# Patient Record
Sex: Male | Born: 1970 | Race: Black or African American | Hispanic: No | Marital: Married | Smoking: Current every day smoker
Health system: Southern US, Community
[De-identification: ages and names within clinical notes are randomized; demographics above are authoritative.]

## PROBLEM LIST (undated history)

## (undated) DIAGNOSIS — S76112A Strain of left quadriceps muscle, fascia and tendon, initial encounter: Secondary | ICD-10-CM

## (undated) DIAGNOSIS — F17200 Nicotine dependence, unspecified, uncomplicated: Secondary | ICD-10-CM

---

## 2006-02-27 ENCOUNTER — Emergency Department (HOSPITAL_COMMUNITY): Admission: EM | Admit: 2006-02-27 | Discharge: 2006-02-27 | Payer: Self-pay | Admitting: Emergency Medicine

## 2006-07-29 ENCOUNTER — Ambulatory Visit: Payer: Self-pay | Admitting: Nurse Practitioner

## 2006-08-10 ENCOUNTER — Ambulatory Visit (HOSPITAL_COMMUNITY): Admission: RE | Admit: 2006-08-10 | Discharge: 2006-08-10 | Payer: Self-pay | Admitting: Nurse Practitioner

## 2006-08-18 ENCOUNTER — Ambulatory Visit: Payer: Self-pay | Admitting: Nurse Practitioner

## 2006-08-20 ENCOUNTER — Ambulatory Visit (HOSPITAL_COMMUNITY): Admission: RE | Admit: 2006-08-20 | Discharge: 2006-08-20 | Payer: Self-pay | Admitting: Family Medicine

## 2006-09-02 ENCOUNTER — Ambulatory Visit: Payer: Self-pay | Admitting: *Deleted

## 2007-05-26 ENCOUNTER — Encounter (INDEPENDENT_AMBULATORY_CARE_PROVIDER_SITE_OTHER): Payer: Self-pay | Admitting: *Deleted

## 2009-02-21 ENCOUNTER — Emergency Department (HOSPITAL_COMMUNITY): Admission: EM | Admit: 2009-02-21 | Discharge: 2009-02-21 | Payer: Self-pay | Admitting: Emergency Medicine

## 2010-07-09 ENCOUNTER — Ambulatory Visit: Payer: Self-pay | Admitting: Internal Medicine

## 2010-08-13 ENCOUNTER — Encounter (INDEPENDENT_AMBULATORY_CARE_PROVIDER_SITE_OTHER): Payer: Self-pay | Admitting: *Deleted

## 2010-08-13 LAB — CONVERTED CEMR LAB
AST: 27 units/L (ref 0–37)
Alkaline Phosphatase: 40 units/L (ref 39–117)
Basophils Absolute: 0 10*3/uL (ref 0.0–0.1)
Basophils Relative: 0 % (ref 0–1)
Eosinophils Absolute: 0.1 10*3/uL (ref 0.0–0.7)
Eosinophils Relative: 2 % (ref 0–5)
Glucose, Bld: 81 mg/dL (ref 70–99)
Lymphocytes Relative: 53 % — ABNORMAL HIGH (ref 12–46)
Monocytes Relative: 7 % (ref 3–12)
Neutro Abs: 2.6 10*3/uL (ref 1.7–7.7)
Platelets: 173 10*3/uL (ref 150–400)
Sodium: 141 meq/L (ref 135–145)
Total Bilirubin: 0.4 mg/dL (ref 0.3–1.2)
Uric Acid, Serum: 3.5 mg/dL — ABNORMAL LOW (ref 4.0–7.8)
WBC: 6.8 10*3/uL (ref 4.0–10.5)

## 2010-09-10 ENCOUNTER — Ambulatory Visit (HOSPITAL_COMMUNITY)
Admission: RE | Admit: 2010-09-10 | Discharge: 2010-09-10 | Payer: Self-pay | Source: Home / Self Care | Attending: Gastroenterology | Admitting: Gastroenterology

## 2010-10-04 NOTE — Op Note (Signed)
  [name], Geoffrey Robertson              ACCOUNT NO.:  [id number]  MEDICAL RECORD NO.:  [id number]          PATIENT TYPE:  AMB  LOCATION:  ENDO                         FACILITY:  [facility]  PHYSICIAN:  Bernette Redbird, M.D.   DATE OF BIRTH:  03-20-1971  DATE OF PROCEDURE:  09/10/2010 DATE OF DISCHARGE:                              OPERATIVE REPORT   PROCEDURE:  Colonoscopy.  INDICATION:  A 40 year old African-American male with intermittent rectal bleeding and heme-positive stool on digital exam, attributed most likely to hemorrhoidal origin.  FINDINGS:  Normal exam to the cecum except for moderate internal hemorrhoids.  DESCRIPTION OF PROCEDURE:  The nature, purpose, risks and alternatives of the procedure have been discussed with the patient who provided written consent, coming as an outpatient to the [facility] Unit.  Sedation prior to and during the course of the procedure was fentanyl 100 mcg and Versed 10 mg IV without any clinical instability during the course the procedure.  Perianal exam was unremarkable. Digital exam showed a normal prostate and no rectal masses.  The Pentax adult video colonoscope was advanced with moderate difficulty to the cecum, using external abdominal compression and placing the patient in the supine position to help control looping.  Looping was the main reason for difficulty with advancement.  The quality of the prep was excellent and it is felt that all areas were well seen.  This was a normal examination except for moderate internal hemorrhoids, best seen on retroflex viewing but also during pullout through the anal canal.  Specifically, no polyps, masses, diverticular disease or inflammation were seen.  There was one 3-mm erythematous area that might represent a small AVM in the midportion of colon.  Retroflex viewing showed hypertrophied anal papillae and moderate internal hemorrhoids that were somewhat excoriated.  No biopsies  were obtained.  The patient tolerated the procedure well and there were no apparent complications.  IMPRESSION:  Moderate internal hemorrhoids which are the presumed source of the patient's bleeding.  No alternative source of bleeding identified.  PLAN: 1. Reassurance. 2. P.r.n. surgical intervention such as ligation therapy or injection     therapy to reduce rectal bleeding. 3. Repeat colonoscopy in 10 years for screening purposes.          ______________________________ Bernette Redbird, M.D.     RB/MEDQ  D:  09/10/2010  T:  09/10/2010  Job:  [id number]  cc:   Georganna Skeans, MD Fax: [phone]  Electronically Signed by Bernette Redbird M.D. on 10/04/2010 04:52:10 PM

## 2011-01-18 ENCOUNTER — Emergency Department (HOSPITAL_COMMUNITY)
Admission: EM | Admit: 2011-01-18 | Discharge: 2011-01-18 | Disposition: A | Discharge status: Home / Self Care | Payer: Self-pay | Attending: Emergency Medicine | Admitting: Emergency Medicine

## 2011-01-18 ENCOUNTER — Emergency Department (HOSPITAL_COMMUNITY): Payer: Self-pay

## 2011-01-18 DIAGNOSIS — S61209A Unspecified open wound of unspecified finger without damage to nail, initial encounter: Secondary | ICD-10-CM | POA: Insufficient documentation

## 2011-01-18 DIAGNOSIS — M79609 Pain in unspecified limb: Secondary | ICD-10-CM | POA: Insufficient documentation

## 2011-01-18 DIAGNOSIS — X58XXXA Exposure to other specified factors, initial encounter: Secondary | ICD-10-CM | POA: Insufficient documentation

## 2017-01-07 ENCOUNTER — Ambulatory Visit (INDEPENDENT_AMBULATORY_CARE_PROVIDER_SITE_OTHER): Payer: Self-pay

## 2017-01-07 ENCOUNTER — Ambulatory Visit (INDEPENDENT_AMBULATORY_CARE_PROVIDER_SITE_OTHER): Payer: Self-pay | Admitting: Podiatry

## 2017-01-07 DIAGNOSIS — S96829A Laceration of other specified muscles and tendons at ankle and foot level, unspecified foot, initial encounter: Secondary | ICD-10-CM

## 2017-01-07 DIAGNOSIS — S96929A Laceration of unspecified muscle and tendon at ankle and foot level, unspecified foot, initial encounter: Secondary | ICD-10-CM

## 2017-01-07 DIAGNOSIS — M79671 Pain in right foot: Secondary | ICD-10-CM

## 2017-01-07 NOTE — Patient Instructions (Signed)
Pre-Operative Instructions  Congratulations, you have decided to take an important step to improving your quality of life.  You can be assured that the doctors of [facility] will be with you every step of the way.  1. Plan to be at the surgery center/hospital at least 1 (one) hour prior to your scheduled time unless otherwise directed by the surgical center/hospital staff.  You must have a responsible adult accompany you, remain during the surgery and drive you home.  Make sure you have directions to the surgical center/hospital and know how to get there on time. 2. For hospital based surgery you will need to obtain a history and physical form from your family physician within 1 month prior to the date of surgery- we will give you a form for you primary physician.  3. We make every effort to accommodate the date you request for surgery.  There are however, times where surgery dates or times have to be moved.  We will contact you as soon as possible if a change in schedule is required.   4. No Aspirin/Ibuprofen for one week before surgery.  If you are on aspirin, any non-steroidal anti-inflammatory medications (Mobic, Aleve, Ibuprofen) you should stop taking it 7 days prior to your surgery.  You make take Tylenol  For pain prior to surgery.  5. Medications- If you are taking daily heart and blood pressure medications, seizure, reflux, allergy, asthma, anxiety, pain or diabetes medications, make sure the surgery center/hospital is aware before the day of surgery so they may notify you which medications to take or avoid the day of surgery. 6. No food or drink after midnight the night before surgery unless directed otherwise by surgical center/hospital staff. 7. No alcoholic beverages 24 hours prior to surgery.  No smoking 24 hours prior to or 24 hours after surgery. 8. Wear loose pants or shorts- loose enough to fit over bandages, boots, and casts. 9. No slip on shoes, sneakers are best. 10. Bring  your boot with you to the surgery center/hospital.  Also bring crutches or a walker if your physician has prescribed it for you.  If you do not have this equipment, it will be provided for you after surgery. 11. If you have not been contracted by the surgery center/hospital by the day before your surgery, call to confirm the date and time of your surgery. 12. Leave-time from work may vary depending on the type of surgery you have.  Appropriate arrangements should be made prior to surgery with your employer. 13. Prescriptions will be provided immediately following surgery by your doctor.  Have these filled as soon as possible after surgery and take the medication as directed. 14. Remove nail polish on the operative foot. 15. Wash the night before surgery.  The night before surgery wash the foot and leg well with the antibacterial soap provided and water paying special attention to beneath the toenails and in between the toes.  Rinse thoroughly with water and dry well with a towel.  Perform this wash unless told not to do so by your physician.  Enclosed: 1 Ice pack (please put in freezer the night before surgery)   1 Hibiclens skin cleaner   Pre-op Instructions  If you have any questions regarding the instructions, do not hesitate to call our office.  [place]: [address] [place], [place] [address] [phone]  [place]: [address] [place], [place] [address] [phone]  [place]: [address]  [place], [place] [address] [phone]   Dr.   Norman Regal DPM, Dr. Matthew Wagoner DPM, Dr. M. Todd Hyatt DPM, Dr. Titorya Stover DPM 

## 2017-01-08 ENCOUNTER — Ambulatory Visit
Admission: RE | Admit: 2017-01-08 | Discharge: 2017-01-08 | Disposition: A | Discharge status: Home / Self Care | Payer: Self-pay | Source: Ambulatory Visit | Attending: Podiatry | Admitting: Podiatry

## 2017-01-08 NOTE — Progress Notes (Signed)
   HPI:  46 year old healthy male presents today for evaluation of a laceration to the dorsal aspect of his right foot. Patient dropped a knife on his foot on 01/02/2017. Patient states that is seeing is the night fell on his foot he noticed that his great toes unable to dorsiflex. He states that his great toe is hanging. Patient presents today for further treatment and evaluation   Physical Exam: General: The patient is alert and oriented x3 in no acute distress.  Dermatology: Small laceration noted to the dorsal aspect of the right foot which appears stable with routine healing. No signs of infectious process noted. Skin is warm, dry and supple bilateral lower extremities. Negative for open lesions or macerations.  Vascular: Palpable pedal pulses bilaterally. No edema or erythema noted. Capillary refill within normal limits.  Neurological: Epicritic and protective threshold grossly intact bilaterally.   Musculoskeletal Exam: Loss of dorsiflexion noted to the right great toe. All other extensor tendons appear to be intact  Assessment: 1. EHL tendon laceration right foot   Plan of Care:  1. Patient was evaluated. 2. Today we are going to order an MRI of the right foot to determine the retraction of the proximal portion of the EHL tendon. 3. Today we will initiate authorization for surgery. Surgery will consist of primary repair of extensor tendon right foot. All possible complications and details of the procedure were explained. No guarantees were expressed or implied. 4. Return to clinic 1 week postop   Felecia ShellingBrent M. Evans, DPM [facility]  Dr. Felecia ShellingBrent M. Evans, DPM    [address]                                        [place], [place] [address]                Office [phone]  Fax [phone](336) [phone]

## 2017-01-15 ENCOUNTER — Encounter: Payer: Self-pay | Admitting: Podiatry

## 2017-01-15 ENCOUNTER — Ambulatory Visit: Payer: Self-pay

## 2017-01-15 VITALS — BP 128/78 | HR 74 | Temp 97.8°F

## 2017-01-15 DIAGNOSIS — S96829A Laceration of other specified muscles and tendons at ankle and foot level, unspecified foot, initial encounter: Secondary | ICD-10-CM

## 2017-01-15 DIAGNOSIS — S96929A Laceration of unspecified muscle and tendon at ankle and foot level, unspecified foot, initial encounter: Principal | ICD-10-CM

## 2017-01-15 DIAGNOSIS — Z9889 Other specified postprocedural states: Secondary | ICD-10-CM

## 2017-01-15 DIAGNOSIS — S96929D Laceration of unspecified muscle and tendon at ankle and foot level, unspecified foot, subsequent encounter: Secondary | ICD-10-CM

## 2017-01-15 HISTORY — PX: FOOT SURGERY: SHX648

## 2017-01-16 NOTE — Progress Notes (Signed)
Patient presents status post repair of laceration to the dorsal aspect of his right foot from previous injury. Surgery performed on morning of 01/15/17.   Patient presents in cast, noted moderate amount of blood on gauze and foot. Denies pain at this time, he states that his foot and leg are still numb from anesthesia. VS stable at 128/78 74 97.8T. Denies chest pain SHOB or any other acute symptoms.   Cast was removed leaving sterile gauze in place, moderate amount of blood noted on gauze on dorsal aspect of foot, but no active bleeding present. Sterile gauze applied to present dressing, cast re-applied .   Advised patient of any acute symptom changes that needed to be reported, or he will need to present to ED.  Wife present, patient and wife verbalized understanding of instructions.   Follow up with Dr Logan BoresEvans at regularly schedule appt next week, sooner if acute symptoms occur.

## 2017-01-21 ENCOUNTER — Ambulatory Visit: Payer: Self-pay

## 2017-01-21 ENCOUNTER — Ambulatory Visit (INDEPENDENT_AMBULATORY_CARE_PROVIDER_SITE_OTHER): Payer: Self-pay | Admitting: Podiatry

## 2017-01-21 ENCOUNTER — Encounter: Payer: Self-pay | Admitting: Podiatry

## 2017-01-21 DIAGNOSIS — Z9889 Other specified postprocedural states: Secondary | ICD-10-CM

## 2017-01-22 NOTE — Progress Notes (Signed)
   Subjective:  Patient presents today status post EHL tendon repair of the right foot. DOS: 01/15/17. Patient states he is doing well and has no complaints at this time.   Objective/Physical Exam Skin incisions appear to be well coapted with sutures and staples intact. No sign of infectious process noted. No dehiscence. No active bleeding noted. Moderate edema noted to the surgical extremity.   Assessment: 1. s/p EHL tendon repair of the right foot. DOS: 01/15/17   Plan of Care:  1. Patient was evaluated. 2. Cast removed. 3. Continue nonweightbearing in the cam boot. 4. Return to clinic in 1 week for staple removal.   Felecia ShellingBrent M. Evans, DPM [facility]  Dr. Felecia ShellingBrent M. Evans, DPM    [address]                                        [place], [place] [address]                Office [phone]) [phone]  Fax [phone]

## 2017-01-28 ENCOUNTER — Encounter: Payer: Self-pay | Admitting: Podiatry

## 2017-01-28 ENCOUNTER — Ambulatory Visit (INDEPENDENT_AMBULATORY_CARE_PROVIDER_SITE_OTHER): Payer: Self-pay | Admitting: Podiatry

## 2017-01-28 VITALS — HR 73 | Temp 98.7°F

## 2017-01-28 DIAGNOSIS — Z9889 Other specified postprocedural states: Secondary | ICD-10-CM

## 2017-01-29 NOTE — Progress Notes (Signed)
1. Repair of extensor tendon right foot

## 2017-01-30 NOTE — Progress Notes (Signed)
   Subjective:  Patient presents today status post EHL tendon repair of the right foot. DOS: 01/15/17. Patient states he is doing well and has no complaints at this time.   Objective/Physical Exam Skin incisions appear to be well coapted with sutures and staples intact. No sign of infectious process noted. No dehiscence. No active bleeding noted. Moderate edema noted to the surgical extremity.   Assessment: 1. s/p EHL tendon repair of the right foot. DOS: 01/15/17   Plan of Care:  1. Patient was evaluated. 2. Dressing changed. 3. Staples removed. 4. Return to clinic in 2 weeks.   Felecia ShellingBrent M. Thermon Zulauf, DPM [facility]  Dr. Felecia ShellingBrent M. Shirley Bolle, DPM    [address]                                        [place], [place] [address]                Office [phone]  Fax [phone]

## 2017-02-16 ENCOUNTER — Ambulatory Visit (INDEPENDENT_AMBULATORY_CARE_PROVIDER_SITE_OTHER): Payer: Self-pay | Admitting: Podiatry

## 2017-02-16 ENCOUNTER — Encounter: Payer: Self-pay | Admitting: Podiatry

## 2017-02-16 DIAGNOSIS — Z9889 Other specified postprocedural states: Secondary | ICD-10-CM

## 2017-02-25 NOTE — Progress Notes (Signed)
   Subjective:  Patient presents today status post EHL tendon repair of the right foot. DOS: 01/15/17. Patient states he is doing well and has no complaints at this time. His only concern is when he can wash the foot.   Objective/Physical Exam Skin incisions appear to be well coapted. No sign of infectious process noted. No dehiscence. No active bleeding noted. Moderate edema noted to the surgical extremity.   Assessment: 1. s/p EHL tendon repair of the right foot. DOS: 01/15/17   Plan of Care:  1. Patient was evaluated. 2. Discontinue wearing CAM boot. Postop shoe dispensed.  3. Begin physical therapy, weight bearing and light activity.  4. Return to clinic when necessary.   Felecia ShellingBrent M. Lorrie Strauch, DPM [facility]  Dr. Felecia ShellingBrent M. Michale Emmerich, DPM    [address]                                        [place], [place] [address]                Office [phone]) [phone]  Fax [phone](336) [phone]

## 2018-01-24 ENCOUNTER — Emergency Department (HOSPITAL_COMMUNITY): Payer: Self-pay

## 2018-01-24 ENCOUNTER — Other Ambulatory Visit: Payer: Self-pay

## 2018-01-24 ENCOUNTER — Emergency Department (HOSPITAL_COMMUNITY)
Admission: EM | Admit: 2018-01-24 | Discharge: 2018-01-24 | Disposition: A | Discharge status: Home / Self Care | Payer: Self-pay | Attending: Emergency Medicine | Admitting: Emergency Medicine

## 2018-01-24 ENCOUNTER — Encounter (HOSPITAL_COMMUNITY): Payer: Self-pay | Admitting: Emergency Medicine

## 2018-01-24 DIAGNOSIS — M25512 Pain in left shoulder: Secondary | ICD-10-CM | POA: Insufficient documentation

## 2018-01-24 DIAGNOSIS — M25562 Pain in left knee: Secondary | ICD-10-CM | POA: Insufficient documentation

## 2018-01-24 DIAGNOSIS — Z79899 Other long term (current) drug therapy: Secondary | ICD-10-CM | POA: Insufficient documentation

## 2018-01-24 MED ORDER — KETOROLAC TROMETHAMINE 30 MG/ML IJ SOLN: 15.0000 mg | Freq: Once | INTRAMUSCULAR | Status: DC

## 2018-01-24 MED ORDER — CYCLOBENZAPRINE HCL 10 MG PO TABS: 10.0000 mg | ORAL_TABLET | Freq: Two times a day (BID) | ORAL | 0 refills | Status: DC | PRN

## 2018-01-24 MED ORDER — OXYCODONE-ACETAMINOPHEN 5-325 MG PO TABS: 1.0000 | ORAL_TABLET | ORAL | 0 refills | Status: DC | PRN

## 2018-01-24 MED ORDER — HYDROMORPHONE HCL 2 MG/ML IJ SOLN
1.0000 mg | Freq: Once | INTRAMUSCULAR | Status: AC
  Administered 2018-01-24: 1 mg via INTRAMUSCULAR
  Filled 2018-01-24: qty 1

## 2018-01-24 MED ORDER — KETOROLAC TROMETHAMINE 60 MG/2ML IM SOLN
30.0000 mg | Freq: Once | INTRAMUSCULAR | Status: AC
Start: 2018-01-24 — End: 2018-01-24
  Administered 2018-01-24: 30 mg via INTRAMUSCULAR
  Filled 2018-01-24: qty 2

## 2018-01-24 NOTE — ED Notes (Signed)
Pt. Stated, Im seeing Dr. Delbert Harness for my knee.

## 2018-01-24 NOTE — Discharge Instructions (Addendum)
Rest and ice the affected area Elevate - elevate ankle above level of heart to reduce swelling Ibuprofen - take with food. Take  up to 3-4 times daily Take narcotic pain medicine as needed for severe pain. Take muscle relaxer as needed for spasms Follow up with orthopedics

## 2018-01-24 NOTE — ED Provider Notes (Signed)
[facility] EMERGENCY DEPARTMENT Provider Note   CSN: [id number] Arrival date & time: 01/24/18  0915     History   Chief Complaint Chief Complaint  Patient presents with  . Fall  . Shoulder Pain  . Shoulder Injury    HPI Geoffrey Robertson is a 47 y.o. male who presents with left shoulder pain, left knee pain.  Patient states that he was doing box jumps at the gym yesterday and the box moved and he fell backwards with his left leg under him and his left arm outstretched behind him.  He denies hitting his head.  The patient states that he has a background in physical therapy and believes that he may have torn tendons in his shoulder and believes his quadriceps tendon is torn as well.  He has been able to walk.  He cannot lift his arm without using his right arm to lift it.  He reports severe pain.  He used leftover hydrocodone without significant relief.  He has an appointment with Dr. Eulah Pont tomorrow but states that the pain is so severe that he felt like he could not wait.  HPI  History reviewed. No pertinent past medical history.  There are no active problems to display for this patient.   History reviewed. No pertinent surgical history.      Home Medications    Prior to Admission medications   Medication Sig Start Date End Date Taking? Authorizing Provider  meloxicam (MOBIC) 15 MG tablet Take 15 mg by mouth daily.    Felecia Shelling, DPM  oxyCODONE-acetaminophen (PERCOCET/ROXICET) 5-325 MG tablet Take 1 tablet by mouth every 4 (four) hours as needed for severe pain.    Felecia Shelling, DPM    Family History No family history on file.  Social History Social History   Tobacco Use  . Smoking status: Unknown If Ever Smoked  . Smokeless tobacco: Never Used  Substance Use Topics  . Alcohol use: Not on file  . Drug use: Not on file     Allergies   Patient has no known allergies.   Review of Systems Review of Systems  Musculoskeletal: Positive for  arthralgias and myalgias. Negative for back pain, gait problem and joint swelling.  Neurological: Positive for weakness. Negative for numbness.     Physical Exam Updated Vital Signs BP (!) 132/93 (BP Location: Right Arm)   Pulse 61   Temp 98.3 F (36.8 C) (Oral)   Resp 12   Ht  (1.88 m)   Wt 89.4 kg (197 lb)   SpO2 96%   BMI 25.29 kg/m   Physical Exam  Constitutional: He is oriented to person, place, and time. He appears well-developed and well-nourished. No distress.  Calm and cooperative  HENT:  Head: Normocephalic and atraumatic.  Eyes: Pupils are equal, round, and reactive to light. Conjunctivae are normal. Right eye exhibits no discharge. Left eye exhibits no discharge. No scleral icterus.  Neck: Normal range of motion.  Cardiovascular: Normal rate.  Pulmonary/Chest: Effort normal. No respiratory distress.  Abdominal: He exhibits no distension.  Musculoskeletal:  Left shoulder: No obvious swelling, deformity, or warmth. Significant tenderness to palpation over the anterior and posterior shoulder. ROM deferred due to pain. 5/5 grip strength. N/V intact.  Left knee: Moderate, diffuse swelling of the knee. Palpable defect felt over the quadriceps tendon.   Neurological: He is alert and oriented to person, place, and time.  Skin: Skin is warm and dry.  Psychiatric: He has a  normal mood and affect. His behavior is normal.  Nursing note and vitals reviewed.    ED Treatments / Results  Labs (all labs ordered are listed, but only abnormal results are displayed) Labs Reviewed - No data to display  EKG None  Radiology Dg Shoulder Left  Result Date: 01/24/2018 CLINICAL DATA:  Pt complains of L shoulder pain, states his pain is on top of shoulder. Pt states he "did something stupid" at the gym to cause injury. No previous injuries or surgeries to L shoulder. Best obtainable images due to pt pain. Cou.*comment was truncated* EXAM: LEFT SHOULDER - 2+ VIEW COMPARISON:   None. FINDINGS: Glenohumeral joint is intact. No evidence of scapular fracture or humeral fracture. The acromioclavicular joint is intact. IMPRESSION: No fracture or dislocation. Electronically Signed   By: Genevive Bi M.D.   On: 01/24/2018 10:29    Procedures Procedures (including critical care time)  Medications Ordered in ED Medications  HYDROmorphone (DILAUDID) injection 1 mg (1 mg Intramuscular Given 01/24/18 1140)  ketorolac (TORADOL) injection 30 mg (30 mg Intramuscular Given 01/24/18 1138)     Initial Impression / Assessment and Plan / ED Course  I have reviewed the triage vital signs and the nursing notes.  Pertinent labs & imaging results that were available during my care of the patient were reviewed by me and considered in my medical decision making (see chart for details).  47 year old male with acute left shoulder pain and left knee pain after an injury yesterday doing box jumps.  He has difficulty lifting his shoulder secondary to pain.  X-ray is negative for fracture.  We will give him pain control and placement of sling.  Offered x-ray of the knee as well.  The patient declined stating that he has an appointment with Dr. Eulah Pont tomorrow and he knows it is not broken.  He has been able to walk on it.  He was given pain control and conservative care was discussed.  Return precautions were given.  Final Clinical Impressions(s) / ED Diagnoses   Final diagnoses:  Acute pain of left shoulder  Acute pain of left knee    ED Discharge Orders    None       Bethel Born, PA-C 01/24/18 1500    Loren Racer, MD 01/28/18 1310

## 2018-01-24 NOTE — ED Triage Notes (Signed)
Pt. Stated, I fell yesterday on my shoulder and it really hurts, can not get comfortable.  Left.

## 2018-01-24 NOTE — Progress Notes (Signed)
Orthopedic Tech Progress Note Patient Details:  Geoffrey Robertson [birth year] [id number]  Ortho Devices Type of Ortho Device: Shoulder immobilizer       Saul Fordyce 01/24/2018, 12:39 PM

## 2018-01-25 ENCOUNTER — Encounter (HOSPITAL_BASED_OUTPATIENT_CLINIC_OR_DEPARTMENT_OTHER): Payer: Self-pay | Admitting: *Deleted

## 2018-01-25 ENCOUNTER — Other Ambulatory Visit: Payer: Self-pay

## 2018-01-26 ENCOUNTER — Ambulatory Visit (HOSPITAL_BASED_OUTPATIENT_CLINIC_OR_DEPARTMENT_OTHER): Payer: Self-pay | Admitting: Certified Registered"

## 2018-01-26 ENCOUNTER — Other Ambulatory Visit: Payer: Self-pay

## 2018-01-26 ENCOUNTER — Encounter (HOSPITAL_BASED_OUTPATIENT_CLINIC_OR_DEPARTMENT_OTHER)
Admission: RE | Disposition: A | Discharge status: Home / Self Care | Payer: Self-pay | Source: Ambulatory Visit | Attending: Orthopedic Surgery

## 2018-01-26 ENCOUNTER — Ambulatory Visit (HOSPITAL_BASED_OUTPATIENT_CLINIC_OR_DEPARTMENT_OTHER)
Admission: RE | Admit: 2018-01-26 | Discharge: 2018-01-26 | Disposition: A | Discharge status: Home / Self Care | Payer: Self-pay | Source: Ambulatory Visit | Attending: Orthopedic Surgery | Admitting: Orthopedic Surgery

## 2018-01-26 ENCOUNTER — Encounter (HOSPITAL_BASED_OUTPATIENT_CLINIC_OR_DEPARTMENT_OTHER): Payer: Self-pay | Admitting: Certified Registered"

## 2018-01-26 DIAGNOSIS — S76112A Strain of left quadriceps muscle, fascia and tendon, initial encounter: Secondary | ICD-10-CM | POA: Insufficient documentation

## 2018-01-26 DIAGNOSIS — Y9339 Activity, other involving climbing, rappelling and jumping off: Secondary | ICD-10-CM | POA: Insufficient documentation

## 2018-01-26 DIAGNOSIS — F17210 Nicotine dependence, cigarettes, uncomplicated: Secondary | ICD-10-CM | POA: Insufficient documentation

## 2018-01-26 HISTORY — DX: Nicotine dependence, unspecified, uncomplicated: F17.200

## 2018-01-26 HISTORY — PX: QUADRICEPS TENDON REPAIR: SHX756

## 2018-01-26 HISTORY — DX: Strain of left quadriceps muscle, fascia and tendon, initial encounter: S76.112A

## 2018-01-26 SURGERY — REPAIR, TENDON, QUADRICEPS
Anesthesia: General | Site: Leg Upper | Laterality: Left

## 2018-01-26 MED ORDER — ROPIVACAINE HCL 7.5 MG/ML IJ SOLN
INTRAMUSCULAR | Status: DC | PRN
  Administered 2018-01-26: 25 mL via PERINEURAL

## 2018-01-26 MED ORDER — FENTANYL CITRATE (PF) 100 MCG/2ML IJ SOLN
25.0000 ug | INTRAMUSCULAR | Status: DC | PRN
  Administered 2018-01-26: 50 ug via INTRAVENOUS

## 2018-01-26 MED ORDER — ACETAMINOPHEN 500 MG PO TABS
1000.0000 mg | ORAL_TABLET | Freq: Once | ORAL | Status: AC
  Administered 2018-01-26: 1000 mg via ORAL

## 2018-01-26 MED ORDER — LACTATED RINGERS IV SOLN
INTRAVENOUS | Status: DC
  Administered 2018-01-26 (×2): via INTRAVENOUS

## 2018-01-26 MED ORDER — MEPERIDINE HCL 25 MG/ML IJ SOLN: 6.2500 mg | INTRAMUSCULAR | Status: DC | PRN

## 2018-01-26 MED ORDER — MIDAZOLAM HCL 2 MG/2ML IJ SOLN
INTRAMUSCULAR | Status: AC
  Filled 2018-01-26: qty 2

## 2018-01-26 MED ORDER — OXYCODONE HCL 5 MG PO TABS: 5.0000 mg | ORAL_TABLET | Freq: Once | ORAL | Status: DC | PRN

## 2018-01-26 MED ORDER — ASPIRIN EC 81 MG PO TBEC: 81.0000 mg | DELAYED_RELEASE_TABLET | Freq: Every day | ORAL | 0 refills | Status: AC

## 2018-01-26 MED ORDER — CHLORHEXIDINE GLUCONATE 4 % EX LIQD: 60.0000 mL | Freq: Once | CUTANEOUS | Status: DC

## 2018-01-26 MED ORDER — DOCUSATE SODIUM 100 MG PO CAPS: 100.0000 mg | ORAL_CAPSULE | Freq: Two times a day (BID) | ORAL | 0 refills | Status: AC

## 2018-01-26 MED ORDER — ONDANSETRON HCL 4 MG PO TABS: 4.0000 mg | ORAL_TABLET | Freq: Three times a day (TID) | ORAL | 0 refills | Status: AC | PRN

## 2018-01-26 MED ORDER — LIDOCAINE HCL (CARDIAC) PF 100 MG/5ML IV SOSY
PREFILLED_SYRINGE | INTRAVENOUS | Status: DC | PRN
  Administered 2018-01-26: 30 mg via INTRAVENOUS

## 2018-01-26 MED ORDER — CELECOXIB 200 MG PO CAPS: 200.0000 mg | ORAL_CAPSULE | Freq: Two times a day (BID) | ORAL | 0 refills | Status: AC

## 2018-01-26 MED ORDER — FENTANYL CITRATE (PF) 100 MCG/2ML IJ SOLN
50.0000 ug | INTRAMUSCULAR | Status: DC | PRN
  Administered 2018-01-26: 25 ug via INTRAVENOUS
  Administered 2018-01-26: 100 ug via INTRAVENOUS

## 2018-01-26 MED ORDER — ACETAMINOPHEN 500 MG PO TABS: 1000.0000 mg | ORAL_TABLET | Freq: Three times a day (TID) | ORAL | 0 refills | Status: AC

## 2018-01-26 MED ORDER — MIDAZOLAM HCL 2 MG/2ML IJ SOLN
1.0000 mg | INTRAMUSCULAR | Status: DC | PRN
  Administered 2018-01-26: 2 mg via INTRAVENOUS

## 2018-01-26 MED ORDER — ACETAMINOPHEN 160 MG/5ML PO SOLN: 325.0000 mg | ORAL | Status: DC | PRN

## 2018-01-26 MED ORDER — GABAPENTIN 300 MG PO CAPS
300.0000 mg | ORAL_CAPSULE | Freq: Once | ORAL | Status: AC
  Administered 2018-01-26: 300 mg via ORAL

## 2018-01-26 MED ORDER — LACTATED RINGERS IV SOLN: INTRAVENOUS | Status: DC

## 2018-01-26 MED ORDER — GABAPENTIN 100 MG PO CAPS: 100.0000 mg | ORAL_CAPSULE | Freq: Three times a day (TID) | ORAL | 0 refills | Status: AC | PRN

## 2018-01-26 MED ORDER — OXYCODONE HCL 5 MG PO TABS: 5.0000 mg | ORAL_TABLET | ORAL | 0 refills | Status: AC | PRN

## 2018-01-26 MED ORDER — FENTANYL CITRATE (PF) 100 MCG/2ML IJ SOLN
INTRAMUSCULAR | Status: AC
  Filled 2018-01-26: qty 2

## 2018-01-26 MED ORDER — ACETAMINOPHEN 325 MG PO TABS: 325.0000 mg | ORAL_TABLET | ORAL | Status: DC | PRN

## 2018-01-26 MED ORDER — 0.9 % SODIUM CHLORIDE (POUR BTL) OPTIME
TOPICAL | Status: DC | PRN
  Administered 2018-01-26: 500 mL

## 2018-01-26 MED ORDER — METHOCARBAMOL 500 MG PO TABS: 500.0000 mg | ORAL_TABLET | Freq: Four times a day (QID) | ORAL | 0 refills | Status: AC | PRN

## 2018-01-26 MED ORDER — DEXAMETHASONE SODIUM PHOSPHATE 10 MG/ML IJ SOLN
INTRAMUSCULAR | Status: DC | PRN
  Administered 2018-01-26: 10 mg via INTRAVENOUS

## 2018-01-26 MED ORDER — PROPOFOL 10 MG/ML IV BOLUS
INTRAVENOUS | Status: DC | PRN
  Administered 2018-01-26: 200 mg via INTRAVENOUS

## 2018-01-26 MED ORDER — ONDANSETRON HCL 4 MG/2ML IJ SOLN
INTRAMUSCULAR | Status: DC | PRN
  Administered 2018-01-26: 4 mg via INTRAVENOUS

## 2018-01-26 MED ORDER — OXYCODONE HCL 5 MG/5ML PO SOLN: 5.0000 mg | Freq: Once | ORAL | Status: DC | PRN

## 2018-01-26 MED ORDER — CEFAZOLIN SODIUM-DEXTROSE 2-4 GM/100ML-% IV SOLN
INTRAVENOUS | Status: AC
Start: 2018-01-26 — End: ?
  Filled 2018-01-26: qty 100

## 2018-01-26 MED ORDER — ONDANSETRON HCL 4 MG/2ML IJ SOLN: 4.0000 mg | Freq: Once | INTRAMUSCULAR | Status: DC | PRN

## 2018-01-26 MED ORDER — GABAPENTIN 300 MG PO CAPS
ORAL_CAPSULE | ORAL | Status: AC
  Filled 2018-01-26: qty 1

## 2018-01-26 MED ORDER — SCOPOLAMINE 1 MG/3DAYS TD PT72: 1.0000 | MEDICATED_PATCH | Freq: Once | TRANSDERMAL | Status: DC | PRN

## 2018-01-26 MED ORDER — CEFAZOLIN SODIUM-DEXTROSE 2-4 GM/100ML-% IV SOLN
2.0000 g | INTRAVENOUS | Status: AC
  Administered 2018-01-26 (×2): 2 g via INTRAVENOUS

## 2018-01-26 MED ORDER — PROPOFOL 500 MG/50ML IV EMUL
INTRAVENOUS | Status: AC
  Filled 2018-01-26: qty 50

## 2018-01-26 MED ORDER — ACETAMINOPHEN 500 MG PO TABS
ORAL_TABLET | ORAL | Status: AC
  Filled 2018-01-26: qty 2

## 2018-01-26 SURGICAL SUPPLY — 66 items
BAG DECANTER FOR FLEXI CONT (MISCELLANEOUS) IMPLANT
BANDAGE ACE 6X5 VEL STRL LF (GAUZE/BANDAGES/DRESSINGS) ×4 IMPLANT
BANDAGE ESMARK 6X9 LF (GAUZE/BANDAGES/DRESSINGS) ×2 IMPLANT
BLADE SURG 10 STRL SS (BLADE) ×4 IMPLANT
BLADE SURG 15 STRL LF DISP TIS (BLADE) ×2 IMPLANT
BLADE SURG 15 STRL SS (BLADE) ×2
BNDG ESMARK 6X9 LF (GAUZE/BANDAGES/DRESSINGS) ×4
CHLORAPREP W/TINT 26ML (MISCELLANEOUS) ×4 IMPLANT
CLEANER CAUTERY TIP 5X5 PAD (MISCELLANEOUS) IMPLANT
CLOSURE STERI-STRIP 1/2X4 (GAUZE/BANDAGES/DRESSINGS) ×1
CLSR STERI-STRIP ANTIMIC 1/2X4 (GAUZE/BANDAGES/DRESSINGS) ×3 IMPLANT
DECANTER SPIKE VIAL GLASS SM (MISCELLANEOUS) IMPLANT
DRAPE EXTREMITY T 121X128X90 (DRAPE) ×4 IMPLANT
DRAPE U-SHAPE 47X51 STRL (DRAPES) ×4 IMPLANT
DRSG ADAPTIC 3X8 NADH LF (GAUZE/BANDAGES/DRESSINGS) ×4 IMPLANT
DRSG EMULSION OIL 3X3 NADH (GAUZE/BANDAGES/DRESSINGS) IMPLANT
DRSG MEPILEX BORDER 4X8 (GAUZE/BANDAGES/DRESSINGS) ×4 IMPLANT
DRSG PAD ABDOMINAL 8X10 ST (GAUZE/BANDAGES/DRESSINGS) ×4 IMPLANT
ELECT REM PT RETURN 9FT ADLT (ELECTROSURGICAL) ×4
ELECTRODE REM PT RTRN 9FT ADLT (ELECTROSURGICAL) ×2 IMPLANT
GAUZE SPONGE 4X4 12PLY STRL (GAUZE/BANDAGES/DRESSINGS) ×4 IMPLANT
GLOVE BIO SURGEON STRL SZ7.5 (GLOVE) ×8 IMPLANT
GLOVE BIOGEL PI IND STRL 8 (GLOVE) ×4 IMPLANT
GLOVE BIOGEL PI INDICATOR 8 (GLOVE) ×4
GOWN STRL REUS W/ TWL LRG LVL3 (GOWN DISPOSABLE) ×4 IMPLANT
GOWN STRL REUS W/ TWL XL LVL3 (GOWN DISPOSABLE) ×2 IMPLANT
GOWN STRL REUS W/TWL LRG LVL3 (GOWN DISPOSABLE) ×4
GOWN STRL REUS W/TWL XL LVL3 (GOWN DISPOSABLE) ×2
IMMOBILIZER KNEE 22 UNIV (SOFTGOODS) IMPLANT
IMMOBILIZER KNEE 24 THIGH 36 (MISCELLANEOUS) IMPLANT
IMMOBILIZER KNEE 24 UNIV (MISCELLANEOUS)
KIT SUTURETAK 3 SPEAR TROCAR (KITS) IMPLANT
NDL SUT 6 .5 CRC .975X.05 MAYO (NEEDLE) ×2 IMPLANT
NEEDLE MAYO TAPER (NEEDLE) ×2
NEEDLE MAYO TROCAR (NEEDLE) ×4 IMPLANT
NS IRRIG 1000ML POUR BTL (IV SOLUTION) ×4 IMPLANT
PACK ARTHROSCOPY DSU (CUSTOM PROCEDURE TRAY) ×4 IMPLANT
PACK BASIN DAY SURGERY FS (CUSTOM PROCEDURE TRAY) ×4 IMPLANT
PAD CLEANER CAUTERY TIP 5X5 (MISCELLANEOUS)
PADDING CAST COTTON 6X4 STRL (CAST SUPPLIES) ×4 IMPLANT
PENCIL BUTTON HOLSTER BLD 10FT (ELECTRODE) ×4 IMPLANT
RETRIEVER SUT HEWSON (MISCELLANEOUS) ×4 IMPLANT
SLEEVE SCD COMPRESS KNEE MED (MISCELLANEOUS) ×4 IMPLANT
SPONGE LAP 18X18 X RAY DECT (DISPOSABLE) ×4 IMPLANT
SPONGE LAP 4X18 RFD (DISPOSABLE) ×4 IMPLANT
STAPLER VISISTAT 35W (STAPLE) IMPLANT
SUT FIBERWIRE #2 38 REV NDL BL (SUTURE) ×4
SUT FIBERWIRE #2 38 T-5 BLUE (SUTURE) ×8
SUT MNCRL AB 4-0 PS2 18 (SUTURE) ×4 IMPLANT
SUT MON AB 2-0 CT1 36 (SUTURE) ×4 IMPLANT
SUT VIC AB 0 CT1 18XCR BRD 8 (SUTURE) ×2 IMPLANT
SUT VIC AB 0 CT1 27 (SUTURE) ×4
SUT VIC AB 0 CT1 27XBRD ANBCTR (SUTURE) ×4 IMPLANT
SUT VIC AB 0 CT1 8-18 (SUTURE) ×2
SUT VIC AB 0 SH 27 (SUTURE) IMPLANT
SUT VIC AB 1 CT1 27 (SUTURE)
SUT VIC AB 1 CT1 27XBRD ANBCTR (SUTURE) IMPLANT
SUT VIC AB 2-0 SH 27 (SUTURE) ×2
SUT VIC AB 2-0 SH 27XBRD (SUTURE) ×2 IMPLANT
SUTURE FIBERWR #2 38 T-5 BLUE (SUTURE) ×4 IMPLANT
SUTURE FIBERWR#2 38 REV NDL BL (SUTURE) ×2 IMPLANT
SYR BULB 3OZ (MISCELLANEOUS) ×4 IMPLANT
TOWEL OR 17X24 6PK STRL BLUE (TOWEL DISPOSABLE) ×4 IMPLANT
TOWEL OR NON WOVEN STRL DISP B (DISPOSABLE) ×4 IMPLANT
UNDERPAD 30X30 (UNDERPADS AND DIAPERS) ×4 IMPLANT
YANKAUER SUCT BULB TIP NO VENT (SUCTIONS) ×4 IMPLANT

## 2018-01-26 NOTE — Transfer of Care (Signed)
Immediate Anesthesia Transfer of Care Note  Patient: Geoffrey Robertson  Procedure(s) Performed: REPAIR EXTENSOR TENDON LEFT QUAD WITH COLLATERAL CAPSULE REPAIR (Left Leg Upper)  Patient Location: PACU  Anesthesia Type:GA combined with regional for post-op pain  Level of Consciousness: awake and patient cooperative  Airway & Oxygen Therapy: Patient Spontanous Breathing and Patient connected to face mask oxygen  Post-op Assessment: Report given to RN and Post -op Vital signs reviewed and stable  Post vital signs: Reviewed and stable  Last Vitals:  Vitals Value Taken Time  BP    Temp    Pulse 57 01/26/2018  9:45 AM  Resp 10 01/26/2018  9:45 AM  SpO2 100 % 01/26/2018  9:45 AM  Vitals shown include unvalidated device data.  Last Pain:  Vitals:   01/26/18 0800  TempSrc:   PainSc: 0-No pain         Complications: No apparent anesthesia complications

## 2018-01-26 NOTE — Discharge Instructions (Signed)
Tylenol given at 7:13 am IV, no tylenol before 1:00 pm today.     Elevate leg and apply ice to reduce pain and swelling. If needed, you may increase pain medication for the first few days post op to 2 tablets every 4 hours.  Maintain knee immobilizer and keep leg straight at all times until follow up.  Diet: As you were doing prior to hospitalization   Dressing:  Keep dressings on and dry until follow up.  Activity:  Increase activity slowly as tolerated, but follow the weight bearing instructions below.  The rules on driving is that you can not be taking narcotics while you drive, and you must feel in control of the vehicle.    Weight Bearing:   As tolerated while wearing knee immobilizer.  To prevent constipation: you may use a stool softener such as -  Colace (over the counter) 100 mg by mouth twice a day  Drink plenty of fluids (prune juice may be helpful) and high fiber foods Miralax (over the counter) for constipation as needed.    Itching:  If you experience itching with your medications, try taking only a single pain pill, or even half a pain pill at a time.  You can also use benadryl over the counter for itching or also to help with sleep.   Precautions:  If you experience chest pain or shortness of breath - call 911 immediately for transfer to the hospital emergency department!!  If you develop a fever greater that 101 F, purulent drainage from wound, increased redness or drainage from wound, or calf pain -- Call the office at [phone]                                                 Follow- Up Appointment:  Please call for an appointment to be seen in 2 weeks [place] - [phone]    Regional Anesthesia Blocks  1. Numbness or the inability to move the "blocked" extremity may last from 3-48 hours after placement. The length of time depends on the medication injected and your individual response to the medication. If the numbness is not going away after 48  hours, call your surgeon.  2. The extremity that is blocked will need to be protected until the numbness is gone and the  Strength has returned. Because you cannot feel it, you will need to take extra care to avoid injury. Because it may be weak, you may have difficulty moving it or using it. You may not know what position it is in without looking at it while the block is in effect.  3. For blocks in the legs and feet, returning to weight bearing and walking needs to be done carefully. You will need to wait until the numbness is entirely gone and the strength has returned. You should be able to move your leg and foot normally before you try and bear weight or walk. You will need someone to be with you when you first try to ensure you do not fall and possibly risk injury.  4. Bruising and tenderness at the needle site are common side effects and will resolve in a few days.  5. Persistent numbness or new problems with movement should be communicated to the surgeon or the [facility] [phone] [facility] [phone]).  Post Anesthesia Home Care Instructions  Activity: Get plenty of rest for the remainder of the day. A responsible individual must stay with you for 24 hours following the procedure.  For the next 24 hours, DO NOT: -Drive a car -Advertising copywriter -Drink alcoholic beverages -Take any medication unless instructed by your physician -Make any legal decisions or sign important papers.  Meals: Start with liquid foods such as gelatin or soup. Progress to regular foods as tolerated. Avoid greasy, spicy, heavy foods. If nausea and/or vomiting occur, drink only clear liquids until the nausea and/or vomiting subsides. Call your physician if vomiting continues.  Special Instructions/Symptoms: Your throat may feel dry or sore from the anesthesia or the breathing tube placed in your throat during surgery. If this causes discomfort, gargle with warm  salt water. The discomfort should disappear within 24 hours.  If you had a scopolamine patch placed behind your ear for the management of post- operative nausea and/or vomiting:  1. The medication in the patch is effective for 72 hours, after which it should be removed.  Wrap patch in a tissue and discard in the trash. Wash hands thoroughly with soap and water. 2. You may remove the patch earlier than 72 hours if you experience unpleasant side effects which may include dry mouth, dizziness or visual disturbances. 3. Avoid touching the patch. Wash your hands with soap and water after contact with the patch.

## 2018-01-26 NOTE — H&P (Signed)
ORTHOPAEDIC CONSULTATION  REQUESTING PHYSICIAN: Renette Butters, MD  Chief Complaint: left knee injury  HPI: Geoffrey Robertson is a 47 y.o. male who complains of a missed box jump and pop in his knee  Past Medical History:  Diagnosis Date  . Quadriceps muscle rupture, left, initial encounter   . Smoker    Past Surgical History:  Procedure Laterality Date  . FOOT SURGERY Right 01/15/2017   Social History   Socioeconomic History  . Marital status: Married    Spouse name: Not on file  . Number of children: Not on file  . Years of education: Not on file  . Highest education level: Not on file  Occupational History  . Not on file  Social Needs  . Financial resource strain: Not on file  . Food insecurity:    Worry: Not on file    Inability: Not on file  . Transportation needs:    Medical: Not on file    Non-medical: Not on file  Tobacco Use  . Smoking status: Current Every Day Smoker    Packs/day: 0.25  . Smokeless tobacco: Never Used  Substance and Sexual Activity  . Alcohol use: Never    Frequency: Never  . Drug use: Never  . Sexual activity: Not on file  Lifestyle  . Physical activity:    Days per week: Not on file    Minutes per session: Not on file  . Stress: Not on file  Relationships  . Social connections:    Talks on phone: Not on file    Gets together: Not on file    Attends religious service: Not on file    Active member of club or organization: Not on file    Attends meetings of clubs or organizations: Not on file    Relationship status: Not on file  Other Topics Concern  . Not on file  Social History Narrative  . Not on file   History reviewed. No pertinent family history. No Known Allergies Prior to Admission medications   Medication Sig Start Date End Date Taking? Authorizing Provider  cyclobenzaprine (FLEXERIL) 10 MG tablet Take 1 tablet (10 mg total) by mouth 2 (two) times daily as needed for muscle spasms. 01/24/18  Yes Recardo Evangelist, PA-C  meloxicam (MOBIC) 15 MG tablet Take 15 mg by mouth daily.   Yes Edrick Kins, DPM  oxyCODONE-acetaminophen (PERCOCET/ROXICET) 5-325 MG tablet Take 1 tablet by mouth every 4 (four) hours as needed for severe pain. 01/24/18  Yes Recardo Evangelist, PA-C   Dg Shoulder Left  Result Date: 01/24/2018 CLINICAL DATA:  Pt complains of L shoulder pain, states his pain is on top of shoulder. Pt states he "did something stupid" at the gym to cause injury. No previous injuries or surgeries to L shoulder. Best obtainable images due to pt pain. Cou.*comment was truncated* EXAM: LEFT SHOULDER - 2+ VIEW COMPARISON:  None. FINDINGS: Glenohumeral joint is intact. No evidence of scapular fracture or humeral fracture. The acromioclavicular joint is intact. IMPRESSION: No fracture or dislocation. Electronically Signed   By: Suzy Bouchard M.D.   On: 01/24/2018 10:29    Positive ROS: All other systems have been reviewed and were otherwise negative with the exception of those mentioned in the HPI and as above.  Labs cbc No results for input(s): WBC, HGB, HCT, PLT in the last 72 hours.  Labs inflam No results for input(s): CRP in the last 72 hours.  Invalid input(s):  ESR  Labs coag No results for input(s): INR, PTT in the last 72 hours.  Invalid input(s): PT  No results for input(s): NA, K, CL, CO2, GLUCOSE, BUN, CREATININE, CALCIUM in the last 72 hours.  Physical Exam: Vitals:   01/26/18 0800 01/26/18 0805  BP: 114/65 110/62  Pulse: (!) 54 (!) 56  Resp: 14 13  Temp:    SpO2: 100% 100%   General: Alert, no acute distress Cardiovascular: No pedal edema Respiratory: No cyanosis, no use of accessory musculature GI: No organomegaly, abdomen is soft and non-tender Skin: No lesions in the area of chief complaint other than those listed below in MSK exam.  Neurologic: Sensation intact distally save for the below mentioned MSK exam Psychiatric: Patient is competent for consent with  normal mood and affect Lymphatic: No axillary or cervical lymphadenopathy  MUSCULOSKELETAL:  LUE: some pain with shoulder ROM LLE: defect at quad insertion. No extensor function. NVI Other extremities are atraumatic with painless ROM and NVI.  Assessment: L quad rupture  Plan: Quad repair today   Renette Butters, MD Cell [phone]   01/26/2018 8:19 AM

## 2018-01-26 NOTE — Anesthesia Preprocedure Evaluation (Signed)
Anesthesia Evaluation  Patient identified by MRN, date of birth, ID band Patient awake    Reviewed: Allergy & Precautions, H&P , NPO status , Patient's Chart, lab work & pertinent test results, reviewed documented beta blocker date and time   Airway Mallampati: II  TM Distance: >3 FB Neck ROM: full    Dental no notable dental hx.    Pulmonary Current Smoker,    Pulmonary exam normal breath sounds clear to auscultation       Cardiovascular Exercise Tolerance: Good negative cardio ROS   Rhythm:regular Rate:Normal     Neuro/Psych negative neurological ROS  negative psych ROS   GI/Hepatic negative GI ROS, Neg liver ROS,   Endo/Other  negative endocrine ROS  Renal/GU negative Renal ROS  negative genitourinary   Musculoskeletal   Abdominal   Peds  Hematology negative hematology ROS (+)   Anesthesia Other Findings   Reproductive/Obstetrics negative OB ROS                             Anesthesia Physical Anesthesia Plan  ASA: II  Anesthesia Plan: General   Post-op Pain Management:    Induction: Intravenous  PONV Risk Score and Plan: 2 and Ondansetron, Dexamethasone and Treatment may vary due to age or medical condition  Airway Management Planned: LMA and Oral ETT  Additional Equipment:   Intra-op Plan:   Post-operative Plan:   Informed Consent: I have reviewed the patients History and Physical, chart, labs and discussed the procedure including the risks, benefits and alternatives for the proposed anesthesia with the patient or authorized representative who has indicated his/her understanding and acceptance.   Dental Advisory Given  Plan Discussed with: CRNA, Anesthesiologist and Surgeon  Anesthesia Plan Comments: ( )        Anesthesia Quick Evaluation

## 2018-01-26 NOTE — Anesthesia Postprocedure Evaluation (Signed)
Anesthesia Post Note  Patient: Geoffrey Robertson  Procedure(s) Performed: REPAIR EXTENSOR TENDON LEFT QUAD WITH COLLATERAL CAPSULE REPAIR (Left Leg Upper)     Patient location during evaluation: PACU Anesthesia Type: General and Regional Level of consciousness: awake and alert Pain management: pain level controlled Vital Signs Assessment: post-procedure vital signs reviewed and stable Respiratory status: spontaneous breathing, nonlabored ventilation, respiratory function stable and patient connected to nasal cannula oxygen Cardiovascular status: blood pressure returned to baseline and stable Postop Assessment: no apparent nausea or vomiting Anesthetic complications: no    Last Vitals:  Vitals:   01/26/18 1100 01/26/18 1124  BP:  131/69  Pulse: (!) 59 (!) 58  Resp: 10 18  Temp:  36.7 C  SpO2: 100% 98%    Last Pain:  Vitals:   01/26/18 1124  TempSrc:   PainSc: 3                  Naithen Rivenburg P Fardowsa Authier

## 2018-01-26 NOTE — Progress Notes (Signed)
AssistedDr. Oddono with left, ultrasound guided, femoral block. Side rails up, monitors on throughout procedure. See vital signs in flow sheet. Tolerated Procedure well.  

## 2018-01-26 NOTE — Anesthesia Procedure Notes (Signed)
Procedure Name: LMA Insertion Date/Time: 01/26/2018 8:28 AM Performed by: Sheryn Bison, CRNA Pre-anesthesia Checklist: Patient identified, Emergency Drugs available, Suction available and Patient being monitored Patient Re-evaluated:Patient Re-evaluated prior to induction Oxygen Delivery Method: Circle system utilized Preoxygenation: Pre-oxygenation with 100% oxygen Induction Type: IV induction Ventilation: Mask ventilation without difficulty LMA: LMA inserted LMA Size: 5.0 Number of attempts: 1 Airway Equipment and Method: Bite block Placement Confirmation: positive ETCO2 Tube secured with: Tape Dental Injury: Teeth and Oropharynx as per pre-operative assessment

## 2018-01-26 NOTE — Anesthesia Procedure Notes (Signed)
Anesthesia Regional Block: Femoral nerve block   Pre-Anesthetic Checklist: ,, timeout performed, Correct Patient, Correct Site, Correct Laterality, Correct Procedure, Correct Position, site marked, Risks and benefits discussed,  Surgical consent,  Pre-op evaluation,  At surgeon's request and post-op pain management  Laterality: Left  Prep: chloraprep       Needles:  Injection technique: Single-shot  Needle Type: Echogenic Stimulator Needle     Needle Length: 5cm  Needle Gauge: 22     Additional Needles:   Procedures:, nerve stimulator,,, ultrasound used (permanent image in chart),,,,   Nerve Stimulator or Paresthesia:  Response: quadraceps contraction, 0.45 mA,   Additional Responses:   Narrative:  Start time: 01/26/2018 7:50 AM End time: 01/26/2018 7:55 AM Injection made incrementally with aspirations every 5 mL.  Performed by: Personally  Anesthesiologist: Bethena Midget, MD  Additional Notes: Functioning IV was confirmed and monitors were applied.  A 50mm 22ga Arrow echogenic stimulator needle was used. Sterile prep and drape,hand hygiene and sterile gloves were used. Ultrasound guidance: relevant anatomy identified, needle position confirmed, local anesthetic spread visualized around nerve(s)., vascular puncture avoided.  Image printed for medical record. Negative aspiration and negative test dose prior to incremental administration of local anesthetic. The patient tolerated the procedure well.

## 2018-01-27 ENCOUNTER — Encounter (HOSPITAL_BASED_OUTPATIENT_CLINIC_OR_DEPARTMENT_OTHER): Payer: Self-pay | Admitting: Orthopedic Surgery

## 2018-02-02 NOTE — Op Note (Signed)
01/26/2018  7:28 AM  PATIENT:  Geoffrey Robertson    PRE-OPERATIVE DIAGNOSIS:  LEFT QUADRICEP TENDON RUPTURE  POST-OPERATIVE DIAGNOSIS:  Same  PROCEDURE:  REPAIR  LEFT QUADRICEP TENDON WITH COLLATERAL CAPSULE REPAIR  SURGEON:  Venice Marcucci, Jewel Baize, MD  ASSISTANT: Aquilla Hacker, PA-C, he was present and scrubbed throughout the case, critical for completion in a timely fashion, and for retraction, instrumentation, and closure.   ANESTHESIA:   gen  PREOPERATIVE INDICATIONS:  Geoffrey Robertson is a  47 y.o. male with a diagnosis of LEFT QUADRICEP TENDON RUPTURE who elected for surgical management.    The risks benefits and alternatives were discussed with the patient preoperatively including but not limited to the risks of infection, bleeding, nerve injury, cardiopulmonary complications, the need for revision surgery, among others, and the patient was willing to proceed.  OPERATIVE FINDINGS: complete rupture  BLOOD LOSS: min  TOURNIQUET TIME:  OPERATIVE PROCEDURE:  Patient was identified in the preoperative holding area and site was marked by me He was transported to the operating theater and placed on the table in supine position taking care to pad all bony prominences. After a preincinduction time out anesthesia was induced. The Left lower extremity was prepped and draped in normal sterile fashion and a pre-incision timeout was performed. He received ancef for preoperative antibiotics.   I made an incision directly over his traumatic injury. I dissected down to the level of the peritenon and elevated skin flaps over top of this there were full-thickness.  I then incised the peritenon it was partially ruptured as well. Identified his rupture tendon.  I debrided tendon from the patella allowing a good bony bed for healing.  I then used 2 #2 FiberWire as a whipstitched up and back in the Tendon leaving me with 4 total strands coming out.  I thoroughly irrigated the joint  Next I used  a drill bit to drill 3 holes in the patella taking care to not penetrate the articular surface. I used a [place] suture passer to pass all 4 stitches through these holes.  The patella reapproximated well to the tendon. I tied the stitches over top of the bone bridge of the patella.  I then stressed the repair and it was stable to 90 degrees.  I then thoroughly irrigated the wound again.  I closed the ruptured peritenon and repaired the collateral capsul on the medial and lateral sides as well as the surgically incised peritenon with an 0 Vicryl. Then closed the skin with a monocryl stitch  Sterile dressing was applied the knee was placed in a knee immobilizer and taken the PACU in stable condition.  POST OPERATIVE PLAN: WBAT in immobilizer, DVT px: early ambulation and ASA
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
## 2019-09-16 IMAGING — CR DG SHOULDER 2+V*L*
2 series · 2 of 2 positions shown · non-contrast
Comparison: None.

CLINICAL DATA: Pt complains of L shoulder pain, states his pain is
on top of shoulder. Pt states he "did something stupid" at the gym
to cause injury. No previous injuries or surgeries to L shoulder.
Best obtainable images due to pt pain. Bayleh...*comment was truncated*

EXAM:
LEFT SHOULDER - 2+ VIEW

[shoulder grashey]
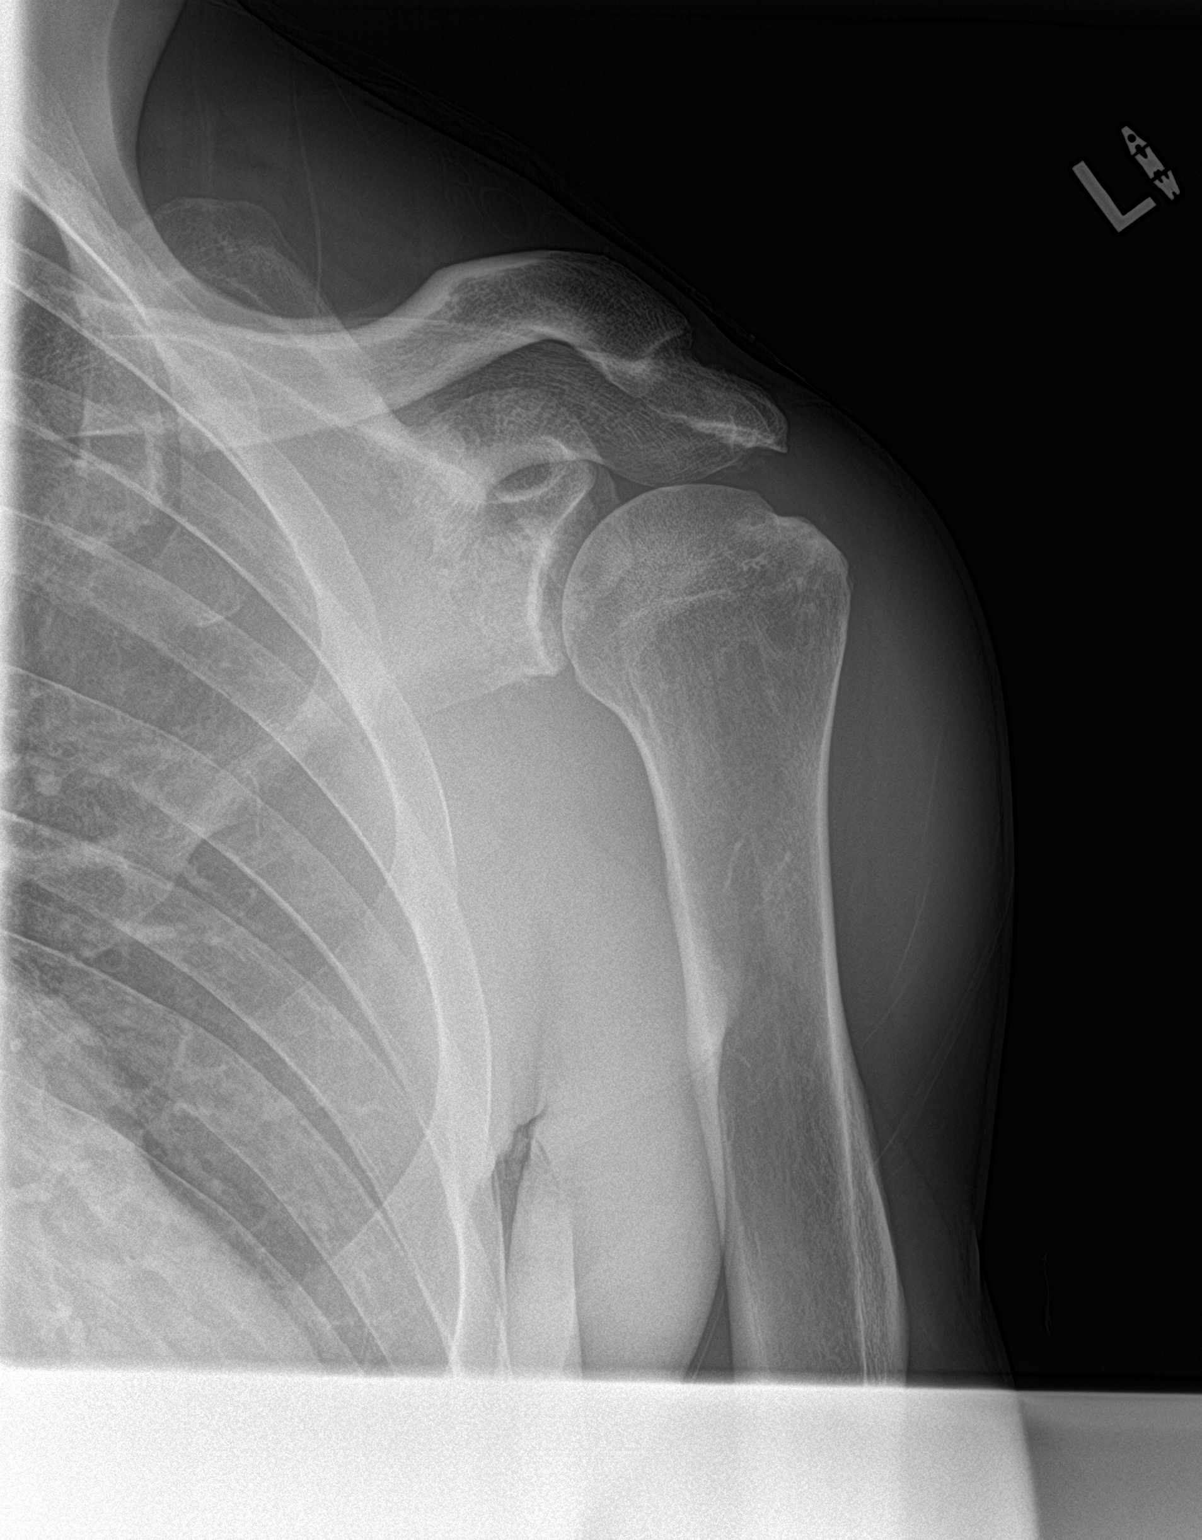

[shoulder y view]
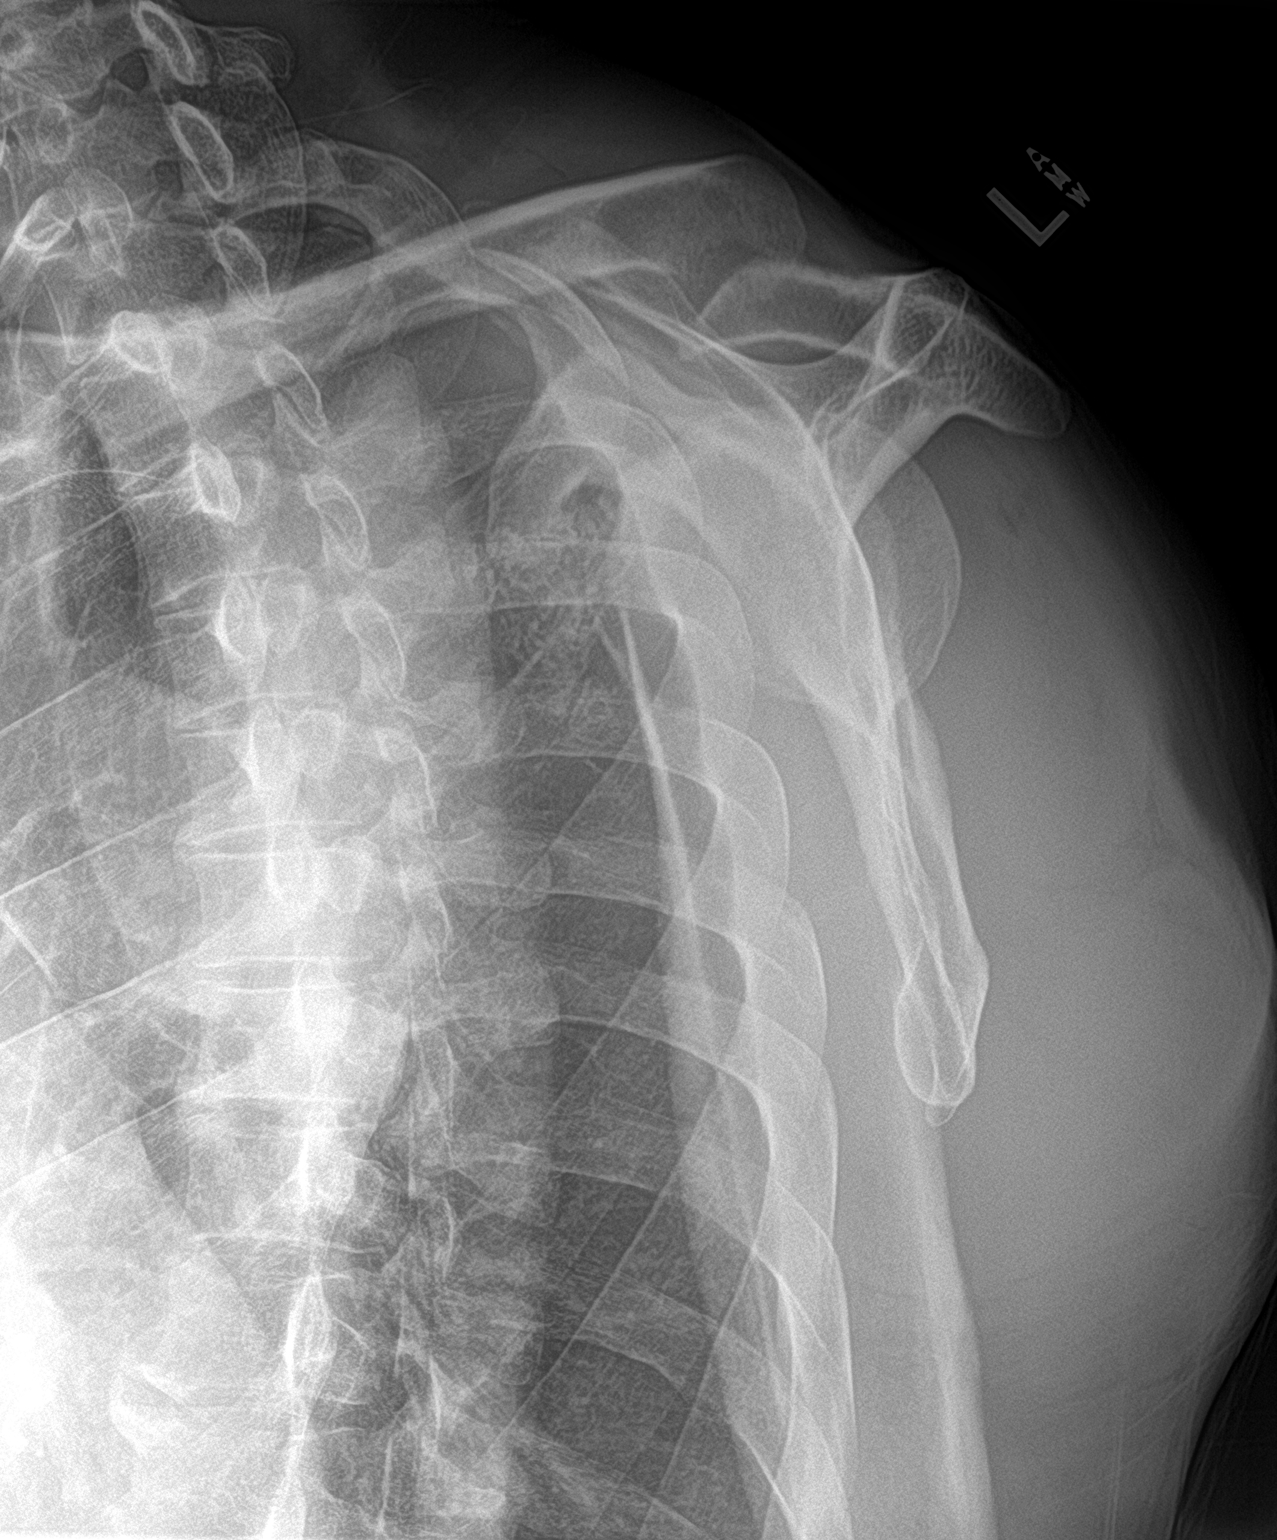

[2 of 2 positions shown; findings below may reference images not displayed]

FINDINGS: Glenohumeral joint is intact. No evidence of scapular fracture or
humeral fracture. The acromioclavicular joint is intact.
IMPRESSION: No fracture or dislocation.

## 2020-01-14 ENCOUNTER — Encounter (HOSPITAL_COMMUNITY): Payer: Self-pay | Admitting: Emergency Medicine

## 2020-01-14 ENCOUNTER — Emergency Department (HOSPITAL_COMMUNITY)
Admission: EM | Admit: 2020-01-14 | Discharge: 2020-01-14 | Disposition: A | Discharge status: Home / Self Care | Payer: BC Managed Care – PPO | Attending: Emergency Medicine | Admitting: Emergency Medicine

## 2020-01-14 ENCOUNTER — Other Ambulatory Visit: Payer: Self-pay

## 2020-01-14 DIAGNOSIS — Y92411 Interstate highway as the place of occurrence of the external cause: Secondary | ICD-10-CM | POA: Insufficient documentation

## 2020-01-14 DIAGNOSIS — M546 Pain in thoracic spine: Secondary | ICD-10-CM | POA: Diagnosis not present

## 2020-01-14 DIAGNOSIS — F17200 Nicotine dependence, unspecified, uncomplicated: Secondary | ICD-10-CM | POA: Insufficient documentation

## 2020-01-14 DIAGNOSIS — Y999 Unspecified external cause status: Secondary | ICD-10-CM | POA: Insufficient documentation

## 2020-01-14 DIAGNOSIS — M25511 Pain in right shoulder: Secondary | ICD-10-CM | POA: Insufficient documentation

## 2020-01-14 DIAGNOSIS — Y939 Activity, unspecified: Secondary | ICD-10-CM | POA: Diagnosis not present

## 2020-01-14 DIAGNOSIS — M542 Cervicalgia: Secondary | ICD-10-CM | POA: Insufficient documentation

## 2020-01-14 DIAGNOSIS — M7918 Myalgia, other site: Secondary | ICD-10-CM

## 2020-01-14 MED ORDER — CYCLOBENZAPRINE HCL 10 MG PO TABS
10.0000 mg | ORAL_TABLET | Freq: Once | ORAL | Status: AC
  Administered 2020-01-14: 23:00:00 10 mg via ORAL
  Filled 2020-01-14: qty 1

## 2020-01-14 MED ORDER — HYDROCODONE-ACETAMINOPHEN 5-325 MG PO TABS
1.0000 | ORAL_TABLET | Freq: Once | ORAL | Status: AC
  Administered 2020-01-14: 1 via ORAL
  Filled 2020-01-14: qty 1

## 2020-01-14 MED ORDER — IBUPROFEN 600 MG PO TABS: 600.0000 mg | ORAL_TABLET | Freq: Four times a day (QID) | ORAL | 0 refills | Status: AC | PRN

## 2020-01-14 MED ORDER — IBUPROFEN 200 MG PO TABS
600.0000 mg | ORAL_TABLET | Freq: Once | ORAL | Status: AC
  Administered 2020-01-14: 600 mg via ORAL
  Filled 2020-01-14: qty 3

## 2020-01-14 MED ORDER — HYDROCODONE-ACETAMINOPHEN 5-325 MG PO TABS: 1.0000 | ORAL_TABLET | ORAL | 0 refills | Status: AC | PRN

## 2020-01-14 MED ORDER — CYCLOBENZAPRINE HCL 10 MG PO TABS: 10.0000 mg | ORAL_TABLET | Freq: Two times a day (BID) | ORAL | 0 refills | Status: AC | PRN
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
## 2020-01-14 NOTE — Discharge Instructions (Addendum)
Recommend cool compresses in the first 48 hours, then alternate cold and heat.   Take medications as prescribed. Return to the ED with any new or worsening symptoms. Otherwise, if you do not see any improvement in 5 days, follow up with your orthopedist for re-examination.
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
## 2022-11-07 DEATH — deceased
